# Patient Record
Sex: Male | Born: 1975 | Race: Black or African American | Hispanic: No | Marital: Single | Smoking: Current every day smoker
Health system: Southern US, Community
[De-identification: ages and names within clinical notes are randomized; demographics above are authoritative.]

---

## 2003-08-30 ENCOUNTER — Emergency Department (HOSPITAL_COMMUNITY): Admission: EM | Admit: 2003-08-30 | Discharge: 2003-08-30 | Payer: Self-pay | Admitting: Emergency Medicine

## 2003-12-13 ENCOUNTER — Emergency Department (HOSPITAL_COMMUNITY): Admission: EM | Admit: 2003-12-13 | Discharge: 2003-12-13 | Payer: Self-pay | Admitting: Emergency Medicine

## 2005-01-24 ENCOUNTER — Emergency Department (HOSPITAL_COMMUNITY): Admission: EM | Admit: 2005-01-24 | Discharge: 2005-01-24 | Payer: Self-pay | Admitting: Emergency Medicine

## 2005-06-04 ENCOUNTER — Emergency Department (HOSPITAL_COMMUNITY): Admission: EM | Admit: 2005-06-04 | Discharge: 2005-06-04 | Payer: Self-pay | Admitting: Emergency Medicine

## 2005-06-13 ENCOUNTER — Emergency Department (HOSPITAL_COMMUNITY): Admission: EM | Admit: 2005-06-13 | Discharge: 2005-06-13 | Payer: Self-pay | Admitting: Emergency Medicine

## 2005-10-21 ENCOUNTER — Emergency Department (HOSPITAL_COMMUNITY): Admission: EM | Admit: 2005-10-21 | Discharge: 2005-10-21 | Payer: Self-pay | Admitting: Emergency Medicine

## 2006-11-03 ENCOUNTER — Emergency Department (HOSPITAL_COMMUNITY): Admission: EM | Admit: 2006-11-03 | Discharge: 2006-11-04 | Payer: Self-pay | Admitting: Emergency Medicine

## 2006-12-16 ENCOUNTER — Emergency Department (HOSPITAL_COMMUNITY): Admission: EM | Admit: 2006-12-16 | Discharge: 2006-12-16 | Payer: Self-pay | Admitting: Emergency Medicine

## 2007-01-03 ENCOUNTER — Ambulatory Visit (HOSPITAL_BASED_OUTPATIENT_CLINIC_OR_DEPARTMENT_OTHER): Admission: RE | Admit: 2007-01-03 | Discharge: 2007-01-03 | Payer: Self-pay | Admitting: Orthopedic Surgery

## 2008-12-25 IMAGING — CR DG CHEST 2V
2 series · 2 of 2 positions shown · non-contrast
Comparison: 10/21/05.

CLINICAL DATA: Productive cough for 3 days.  
 CHEST ? 2 VIEW:

[w chest pa]
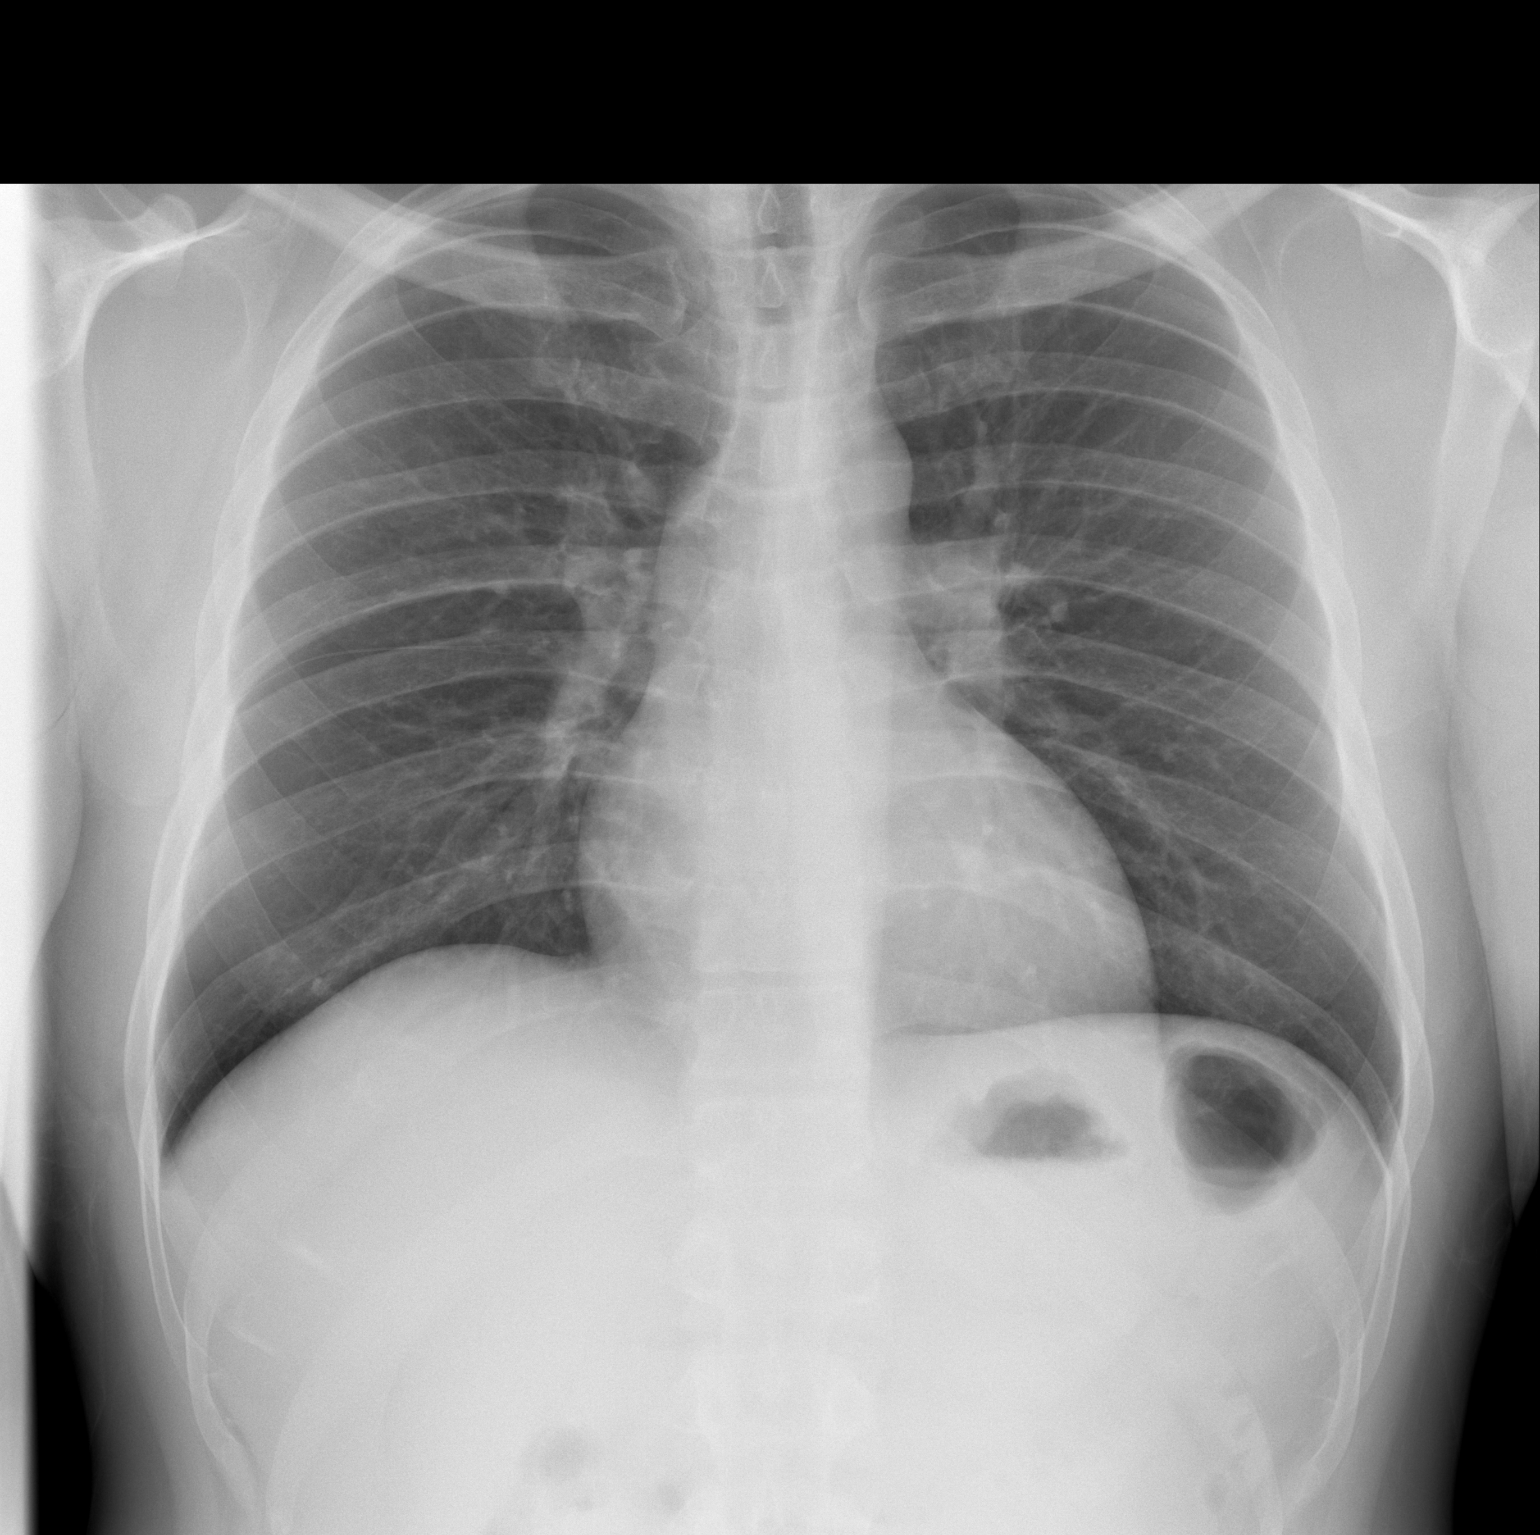

[w chest lat]
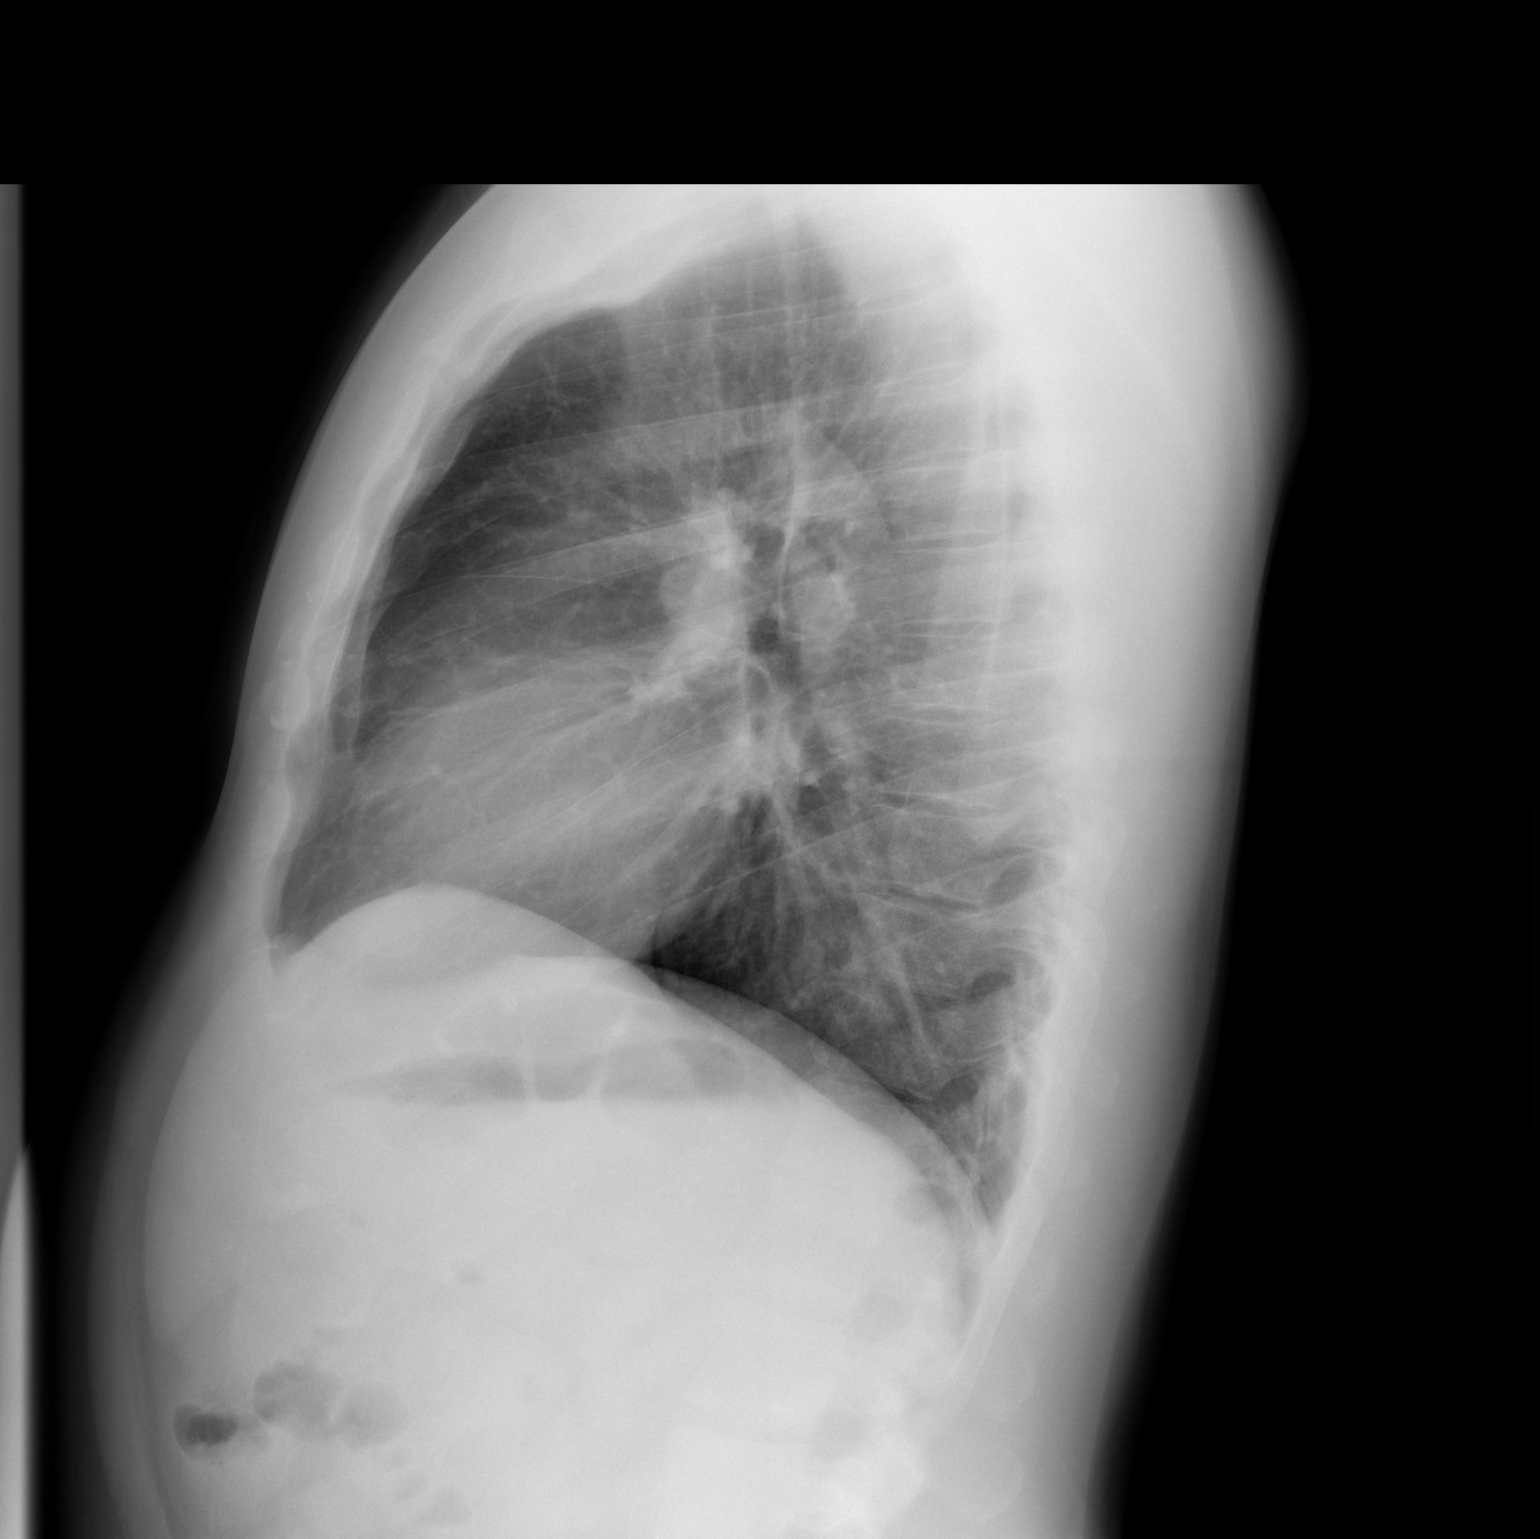

[2 of 2 positions shown; findings below may reference images not displayed]

FINDINGS: Heart size and mediastinal contours are normal.  
 There is no pleural fluid or pulmonary edema.  
 No airspace opacities are identified.
IMPRESSION: No acute cardiopulmonary abnormalities.

## 2009-06-01 ENCOUNTER — Observation Stay (HOSPITAL_COMMUNITY): Admission: EM | Admit: 2009-06-01 | Discharge: 2009-06-02 | Payer: Self-pay | Admitting: Emergency Medicine

## 2010-03-23 LAB — POCT I-STAT 4, (NA,K, GLUC, HGB,HCT)
Glucose, Bld: 96 mg/dL (ref 70–99)
HCT: 45 % (ref 39.0–52.0)

## 2010-04-08 ENCOUNTER — Ambulatory Visit (INDEPENDENT_AMBULATORY_CARE_PROVIDER_SITE_OTHER): Payer: BC Managed Care – PPO

## 2010-04-08 ENCOUNTER — Inpatient Hospital Stay (INDEPENDENT_AMBULATORY_CARE_PROVIDER_SITE_OTHER)
Admission: RE | Admit: 2010-04-08 | Discharge: 2010-04-08 | Disposition: A | Discharge status: Home / Self Care | Payer: BC Managed Care – PPO | Source: Ambulatory Visit | Attending: Emergency Medicine | Admitting: Emergency Medicine

## 2010-04-08 DIAGNOSIS — M533 Sacrococcygeal disorders, not elsewhere classified: Secondary | ICD-10-CM

## 2010-05-19 NOTE — Op Note (Signed)
NAMEPHU, RECORD               ACCOUNT NO.:  [id number]   MEDICAL RECORD NO.:  [id number]          PATIENT TYPE:  AMB   LOCATION:  DSC                          FACILITY:  [facility]   PHYSICIAN:  Leonides Grills, M.D.     DATE OF BIRTH:  1975-11-30   DATE OF PROCEDURE:  01/03/2007  DATE OF DISCHARGE:                               OPERATIVE REPORT   PREOPERATIVE DIAGNOSIS:  Right lateral malleolus fracture.   POSTOPERATIVE DIAGNOSIS:  Right lateral malleolus fracture.   OPERATION:  1. ORIF right lateral malleolus fracture.  2. Stress x-rays right ankle.   ANESTHESIA:  General.   SURGEON:  Leonides Grills, M.D.   ASSISTANT:  Evlyn Kanner, P.A.-C.   ESTIMATED BLOOD LOSS:  Minimal.   TOURNIQUET TIME:  Approximately 40 minutes.   COMPLICATIONS:  None.   DISPOSITION:  Stable to the PR.   INDICATIONS:  This is a 35 year old male who approximately a month ago  fractured his right ankle while wrestling with his friends.  He was  consented for the above procedure.  All risks, which include infection,  nerve or vessel injury, nonunion, union, hardware rotation, hardware  failure, persistent pain, worse pain, prolonged recovery, stiffness, and  arthritis were all explained.  Questions were encouraged and answered.   OPERATION:  The patient was brought to the operating room and placed in  the supine position initially after adequate general endotracheal tube  anesthesia administered, as well as Ancef 1 g IV piggyback.  A bump was  then placed under the right ipsilateral hip, internally rotating the  right lower extremity.  The right lower extremity was then prepped and  draped in a sterile manner over a proximally placed thigh tourniquet.  The limb was gravity exsanguinated.  The tourniquet was elevated to 290  mmHg.  A longitudinal incision over the right lateral malleolus was then  made.  Dissection was carried down through skin.  Hemostasis was  obtained.  Fracture site was then  encountered.  There was already callus  formation, but the fracture was not united.  This was then freed up.  Organized hematoma was removed, and a five-hole, one-third tubular plate  was applied in antilock configuration, and with the clamp on the distal  fragment this was then pulled out to length, and with a separate clamp  this was then reduced anatomically.  We then placed five 3.5 mm fully  threaded cortical set screws using 2.5 mm drill holes, respectively.  This had excellent purchase and maintenance of the reduction.  Final  stress x-rays were obtained, AP, lateral, and mortise views, and showed  no gross motion across the fracture site, fixation, proposition, and  excellent alignment as well.  The medial gutter, which was widened  previously, was now in an anatomic position.  The  tourniquet was deflated,  Hemostasis was obtained.  The area was  copiously irrigated with normal saline.  Subcu was closed with 3-0  Vicryl.  Skin was closed with 4-0 nylon.  A sterile dressing was  applied.  A modified Jones dressing was applied.  The patient was stable  to the PR.      Leonides Grills, M.D.  Electronically Signed     PB/MEDQ  D:  01/03/2007  T:  01/04/2007  Job:  [id number]

## 2010-10-09 LAB — POCT HEMOGLOBIN-HEMACUE
Hemoglobin: 14.2
Operator id: 128471

## 2010-10-14 LAB — RAPID STREP SCREEN (MED CTR MEBANE ONLY): Streptococcus, Group A Screen (Direct): NEGATIVE

## 2016-02-18 ENCOUNTER — Emergency Department (HOSPITAL_COMMUNITY): Payer: Self-pay

## 2016-02-18 ENCOUNTER — Encounter (HOSPITAL_COMMUNITY): Payer: Self-pay | Admitting: *Deleted

## 2016-02-18 ENCOUNTER — Emergency Department (HOSPITAL_COMMUNITY)
Admission: EM | Admit: 2016-02-18 | Discharge: 2016-02-18 | Disposition: A | Discharge status: Home / Self Care | Payer: Self-pay | Attending: Emergency Medicine | Admitting: Emergency Medicine

## 2016-02-18 DIAGNOSIS — F17200 Nicotine dependence, unspecified, uncomplicated: Secondary | ICD-10-CM | POA: Insufficient documentation

## 2016-02-18 DIAGNOSIS — R0789 Other chest pain: Secondary | ICD-10-CM | POA: Insufficient documentation

## 2016-02-18 DIAGNOSIS — Z720 Tobacco use: Secondary | ICD-10-CM

## 2016-02-18 DIAGNOSIS — M25512 Pain in left shoulder: Secondary | ICD-10-CM | POA: Insufficient documentation

## 2016-02-18 LAB — BASIC METABOLIC PANEL
ANION GAP: 9 (ref 5–15)
BUN: <5 mg/dL — ABNORMAL LOW (ref 6–20)
CALCIUM: 9.3 mg/dL (ref 8.9–10.3)
CO2: 24 mmol/L (ref 22–32)
Chloride: 103 mmol/L (ref 101–111)
Creatinine, Ser: 0.87 mg/dL (ref 0.61–1.24)
GFR calc Af Amer: >60 mL/min (ref >60)
GLUCOSE: 108 mg/dL — AB (ref 65–99)
POTASSIUM: 4.2 mmol/L (ref 3.5–5.1)
SODIUM: 136 mmol/L (ref 135–145)

## 2016-02-18 LAB — CBC
HEMATOCRIT: 43.7 % (ref 39.0–52.0)
HEMOGLOBIN: 14.6 g/dL (ref 13.0–17.0)
MCH: 33.6 pg (ref 26.0–34.0)
MCHC: 33.4 g/dL (ref 30.0–36.0)
MCV: 100.7 fL — ABNORMAL HIGH (ref 78.0–100.0)
Platelets: 189 10*3/uL (ref 150–400)
RBC: 4.34 MIL/uL (ref 4.22–5.81)
RDW: 12.3 % (ref 11.5–15.5)
WBC: 7.5 10*3/uL (ref 4.0–10.5)

## 2016-02-18 LAB — I-STAT TROPONIN, ED: TROPONIN I, POC: 0 ng/mL (ref 0.00–0.08)

## 2016-02-18 MED ORDER — KETOROLAC TROMETHAMINE 60 MG/2ML IM SOLN
30.0000 mg | Freq: Once | INTRAMUSCULAR | Status: AC
  Administered 2016-02-18: 30 mg via INTRAMUSCULAR
  Filled 2016-02-18: qty 2

## 2016-02-18 NOTE — ED Triage Notes (Signed)
Pt reports having occ chest pains in past. Now has left shoulder pain since he woke up yesterday. Denies any muscle strain or injury. Reports pain increases with movement. No acute distress noted.

## 2016-02-18 NOTE — Discharge Instructions (Signed)
Please try to quit smoking!  Don't hesitate to return to the emergency department for any new or worsening symptoms.  Using resource guide below to try to establish primary care.  Starting tomorrow you can take Motrin for the shoulder pain as follows: For pain control please take ibuprofen (also known as Motrin or Advil) 800mg  (this is normally 4 over the counter pills) 3 times a day  for 5 days. Take with food to minimize stomach irritation.

## 2016-02-18 NOTE — ED Provider Notes (Signed)
[facility] Provider Note   CSN: [id number] Arrival date & time: 02/18/16  1136     History   Chief Complaint Chief Complaint  Patient presents with  . Shoulder Pain  . Chest Pain     HPI  Martin Kelley is a 41 y.o. male with no significant past medical history who presents with left shoulder pain upon waking yesterday evening. He states that he has had intermittent chest pain for the past few month and was worried that the acute onset shoulder pain could be related to his heart and wanted to be evaluated. Upon waking yesterday his left shoulder felt weak, numb and was aching from the shoulder to the fingers. He lifts boxes at work but denies recent injury or muscle strain. He tried using a muscle relaxer for relief which did not help. His arm is no longer numb and weak at this time, although he complains of 6/10 aching shoulder pain. Patient also has intermittent chest tightness which has been ongoing for the past couple of months. It occurs about once or twice a month and lasts for a few minutes. It is non radiating and is not associated with SOB, nausea, vomiting, peripheral edema, palpitations. Patient smokes tobacco. No family history of sudden cardiac death. Patient is uninsured, he doesn't follow regularly with primary care. He denies any known history of diabetes, hypertension, hyperlipidemia  Home Medications    Prior to Admission medications   Not on File    Family History History reviewed. No pertinent family history.  Social History Social History  Substance Use Topics  . Smoking status: Current Every Day Smoker  . Smokeless tobacco: Not on file  . Alcohol use Yes     Allergies   Patient has no known allergies.   Review of Systems Review of Systems  10 systems reviewed and found to be negative, except as noted in the HPI.  Physical Exam Updated Vital Signs BP 117/94   Pulse (!) 50   Temp 98.5 F (36.9 C) (Oral)   Resp 18   SpO2 98%    Physical Exam  Constitutional: He is oriented to person, place, and time. He appears well-developed and well-nourished. No distress.  HENT:  Head: Normocephalic and atraumatic.  Mouth/Throat: Oropharynx is clear and moist.  Eyes: Conjunctivae and EOM are normal. Pupils are equal, round, and reactive to light.  Neck: Normal range of motion. No JVD present. No tracheal deviation present.  Cardiovascular: Normal rate, regular rhythm and intact distal pulses.   Clubbed fingers bilaterally  Pulmonary/Chest: Effort normal and breath sounds normal. No stridor. No respiratory distress. He has no wheezes. He has no rales. He exhibits no tenderness.  Abdominal: Soft. He exhibits no distension and no mass. There is no tenderness. There is no rebound and no guarding.  Musculoskeletal: Normal range of motion. He exhibits no edema or tenderness.  No calf asymmetry, superficial collaterals, palpable cords, edema, Homans sign negative bilaterally.    Left shoulder:  Shoulder with no deformity. FROM to shoulder and elbow. No TTP of rotator cuff musculature. Drop arm negative. Neurovascularly intact   Neurological: He is alert and oriented to person, place, and time.  Skin: Skin is warm. He is not diaphoretic.  Psychiatric: He has a normal mood and affect.  Nursing note and vitals reviewed.    ED Treatments / Results  Labs (all labs ordered are listed, but only abnormal results are displayed) Labs Reviewed  BASIC METABOLIC PANEL - Abnormal; Notable for the following:  Result Value   Glucose, Bld 108 (*)    BUN <5 (*)    All other components within normal limits  CBC - Abnormal; Notable for the following:    MCV 100.7 (*)    All other components within normal limits  I-STAT TROPOININ, ED    EKG  EKG Interpretation  Date/Time:  Wednesday February 18 2016 11:46:43 EST Ventricular Rate:  63 PR Interval:  162 QRS Duration: 86 QT Interval:  374 QTC Calculation: 382 R  Axis:   61 Text Interpretation:  Normal sinus rhythm Minimal voltage criteria for LVH, may be normal variant Borderline ECG Confirmed by Particia NearingHAVILAND MD, JULIE ([id number]) on 02/18/2016 1:28:15 PM       Radiology Dg Chest 2 View  Result Date: 02/18/2016 CLINICAL DATA:  Chest pain. EXAM: CHEST  2 VIEW COMPARISON:  11/03/2006. FINDINGS: Mediastinum and hilar structures normal. Lungs are clear. Heart size normal. No pleural effusion or pneumothorax. Degenerative changes thoracic spine. IMPRESSION: No acute cardiopulmonary disease. Electronically Signed   By: Maisie Fushomas  Register   On: 02/18/2016 12:00   Dg Shoulder Left  Result Date: 02/18/2016 CLINICAL DATA:  Pain.  No injury. EXAM: LEFT SHOULDER - 2+ VIEW COMPARISON:  No recent prior . FINDINGS: No acute bony or joint abnormality identified. No evidence of fracture dislocation. Mild acromioclavicular and glenohumeral degenerative change. IMPRESSION: Mild acromioclavicular glenohumeral degenerative change. No acute abnormality . Electronically Signed   By: Maisie Fushomas  Register   On: 02/18/2016 13:23    Procedures Procedures (including critical care time)  Medications Ordered in ED Medications  ketorolac (TORADOL) injection 30 mg (30 mg Intramuscular Given 02/18/16 1410)     Initial Impression / Assessment and Plan / ED Course  I have reviewed the triage vital signs and the nursing notes.  Pertinent labs & imaging results that were available during my care of the patient were reviewed by me and considered in my medical decision making (see chart for details).     Vitals:   02/18/16 1335 02/18/16 1345 02/18/16 1400 02/18/16 1430  BP:   133/89 117/94  Pulse: (!) 53 62 61 (!) 50  Resp:      Temp:      TempSrc:      SpO2: 98% 97% 99% 98%    Medications  ketorolac (TORADOL) injection 30 mg (30 mg Intramuscular Given 02/18/16 1410)    Martin Kelley is 41 y.o. male presenting with Intermittent chest pain which is atypical over the last several  months, he's developed left shoulder pain upon waking. It radiates down the arm. Possible impingement, full strength and sensation. EKG with no acute findings, patient is low risk by HEART score.  X-ray of shoulder with arthritis. Patient is given referral to orthopedics, I counseled him extensively on smoking cessation. Given referral to [facility] to try to establish primary care possibly apply for the orange card.  Evaluation does not show pathology that would require ongoing emergent intervention or inpatient treatment. Pt is hemodynamically stable and mentating appropriately. Discussed findings and plan with patient/guardian, who agrees with care plan. All questions answered. Return precautions discussed and outpatient follow up given.    Final Clinical Impressions(s) / ED Diagnoses   Final diagnoses:  Atypical chest pain  Acute pain of left shoulder  Tobacco use    New Prescriptions New Prescriptions   No medications on file     Wynetta Emeryicole Idan Prime, PA-C 02/18/16 1455    Jacalyn LefevreJulie Haviland, MD 02/18/16 1535

## 2016-07-13 ENCOUNTER — Encounter (HOSPITAL_COMMUNITY): Payer: Self-pay | Admitting: Emergency Medicine

## 2016-07-13 ENCOUNTER — Emergency Department (HOSPITAL_COMMUNITY): Payer: Self-pay

## 2016-07-13 ENCOUNTER — Emergency Department (HOSPITAL_COMMUNITY)
Admission: EM | Admit: 2016-07-13 | Discharge: 2016-07-13 | Disposition: A | Discharge status: Home / Self Care | Payer: Self-pay | Attending: Emergency Medicine | Admitting: Emergency Medicine

## 2016-07-13 DIAGNOSIS — J189 Pneumonia, unspecified organism: Secondary | ICD-10-CM | POA: Insufficient documentation

## 2016-07-13 DIAGNOSIS — J181 Lobar pneumonia, unspecified organism: Secondary | ICD-10-CM

## 2016-07-13 DIAGNOSIS — F17200 Nicotine dependence, unspecified, uncomplicated: Secondary | ICD-10-CM | POA: Insufficient documentation

## 2016-07-13 LAB — COMPREHENSIVE METABOLIC PANEL
ALT: 39 U/L (ref 17–63)
AST: 96 U/L — ABNORMAL HIGH (ref 15–41)
Albumin: 3.9 g/dL (ref 3.5–5.0)
Alkaline Phosphatase: 47 U/L (ref 38–126)
Anion gap: 11 (ref 5–15)
BUN: 7 mg/dL (ref 6–20)
CO2: 21 mmol/L — ABNORMAL LOW (ref 22–32)
Calcium: 9.2 mg/dL (ref 8.9–10.3)
Chloride: 94 mmol/L — ABNORMAL LOW (ref 101–111)
Creatinine, Ser: 1 mg/dL (ref 0.61–1.24)
GFR calc Af Amer: >60 mL/min (ref >60)
GFR calc non Af Amer: >60 mL/min (ref >60)
Glucose, Bld: 109 mg/dL — ABNORMAL HIGH (ref 65–99)
Potassium: 3 mmol/L — ABNORMAL LOW (ref 3.5–5.1)
Sodium: 126 mmol/L — ABNORMAL LOW (ref 135–145)
Total Bilirubin: 2 mg/dL — ABNORMAL HIGH (ref 0.3–1.2)
Total Protein: 7.8 g/dL (ref 6.5–8.1)

## 2016-07-13 LAB — CBC WITH DIFFERENTIAL/PLATELET
Basophils Absolute: 0 10*3/uL (ref 0.0–0.1)
Basophils Relative: 0 %
Eosinophils Absolute: 0 10*3/uL (ref 0.0–0.7)
Eosinophils Relative: 0 %
HCT: 43.1 % (ref 39.0–52.0)
Hemoglobin: 15.6 g/dL (ref 13.0–17.0)
Lymphocytes Relative: 17 %
Lymphs Abs: 1.5 10*3/uL (ref 0.7–4.0)
MCH: 35.3 pg — ABNORMAL HIGH (ref 26.0–34.0)
MCHC: 36.2 g/dL — ABNORMAL HIGH (ref 30.0–36.0)
MCV: 97.5 fL (ref 78.0–100.0)
Monocytes Absolute: 0.9 10*3/uL (ref 0.1–1.0)
Monocytes Relative: 11 %
Neutro Abs: 6.3 10*3/uL (ref 1.7–7.7)
Neutrophils Relative %: 72 %
Platelets: 167 10*3/uL (ref 150–400)
RBC: 4.42 MIL/uL (ref 4.22–5.81)
RDW: 12.1 % (ref 11.5–15.5)
WBC: 8.8 10*3/uL (ref 4.0–10.5)

## 2016-07-13 LAB — URINALYSIS, COMPLETE (UACMP) WITH MICROSCOPIC
Bacteria, UA: NONE SEEN
Bilirubin Urine: NEGATIVE
Glucose, UA: NEGATIVE mg/dL
Ketones, ur: 5 mg/dL — AB
Leukocytes, UA: NEGATIVE
Nitrite: NEGATIVE
Protein, ur: 100 mg/dL — AB
Specific Gravity, Urine: 1.018 (ref 1.005–1.030)
Squamous Epithelial / LPF: NONE SEEN
pH: 6 (ref 5.0–8.0)

## 2016-07-13 LAB — RAPID HIV SCREEN (HIV 1/2 AB+AG)
HIV 1/2 Antibodies: NONREACTIVE
HIV-1 P24 Antigen - HIV24: NONREACTIVE

## 2016-07-13 LAB — SEDIMENTATION RATE: Sed Rate: 49 mm/hr — ABNORMAL HIGH (ref 0–16)

## 2016-07-13 LAB — C-REACTIVE PROTEIN: CRP: 19.9 mg/dL — ABNORMAL HIGH (ref <1.0)

## 2016-07-13 MED ORDER — AMOXICILLIN 500 MG PO CAPS: 500.0000 mg | ORAL_CAPSULE | Freq: Three times a day (TID) | ORAL | 0 refills | Status: AC

## 2016-07-13 MED ORDER — DEXTROSE 5 % IV SOLN
1.0000 g | Freq: Once | INTRAVENOUS | Status: DC
  Filled 2016-07-13: qty 10

## 2016-07-13 MED ORDER — SODIUM CHLORIDE 0.9 % IV BOLUS (SEPSIS)
1000.0000 mL | Freq: Once | INTRAVENOUS | Status: AC
  Administered 2016-07-13: 1000 mL via INTRAVENOUS

## 2016-07-13 MED ORDER — AZITHROMYCIN 250 MG PO TABS: 250.0000 mg | ORAL_TABLET | Freq: Every day | ORAL | 0 refills | Status: AC

## 2016-07-13 MED ORDER — POTASSIUM CHLORIDE CRYS ER 20 MEQ PO TBCR
60.0000 meq | EXTENDED_RELEASE_TABLET | Freq: Once | ORAL | Status: AC
  Administered 2016-07-13: 60 meq via ORAL
  Filled 2016-07-13: qty 3

## 2016-07-13 MED ORDER — AZITHROMYCIN 500 MG IV SOLR
500.0000 mg | Freq: Once | INTRAVENOUS | Status: AC
  Administered 2016-07-13: 500 mg via INTRAVENOUS
  Filled 2016-07-13: qty 500

## 2016-07-13 MED ORDER — ACETAMINOPHEN 325 MG PO TABS
325.0000 mg | ORAL_TABLET | Freq: Once | ORAL | Status: AC
  Administered 2016-07-13: 325 mg via ORAL
  Filled 2016-07-13: qty 1

## 2016-07-13 MED ORDER — ACETAMINOPHEN 325 MG PO TABS
325.0000 mg | ORAL_TABLET | Freq: Once | ORAL | Status: AC
  Administered 2016-07-13: 325 mg via ORAL

## 2016-07-13 NOTE — ED Provider Notes (Signed)
[facility] Provider Note   CSN: [id number] Arrival date & time: 07/13/16  0857     History   Chief Complaint Chief Complaint  Patient presents with  . Insomnia  . Excessive Sweating  . Cough  . Headache    HPI Martin Kelley is a 41 y.o. male.  HPI   41yM with cc of insomnia. Reports he has had little/no sleep for about 4 days. He has had additional symptoms for months. Nights sweats. Nonproductive cough. Intermittent dyspnea. Significant weight loss. He cannot quantify but was size 36 waist around January and wearing 32 now. No appetite.   Denies headache. No joint pain/swelling. No rash. No urinary complaints.   No significant past medical history but doesn't have routine medical care. Long time smoker. Denies IVDU. No known malignancy. Denies significant travel history.   History reviewed. No pertinent past medical history.  There are no active problems to display for this patient.   History reviewed. No pertinent surgical history.     Home Medications    Prior to Admission medications   Not on File    Family History No family history on file.  Social History Social History  Substance Use Topics  . Smoking status: Current Every Day Smoker  . Smokeless tobacco: Current User  . Alcohol use Yes     Allergies   Patient has no known allergies.   Review of Systems Review of Systems  All systems reviewed and negative, other than as noted in HPI.  Physical Exam Updated Vital Signs BP 132/88   Pulse (!) 101   Temp (!) 101 F (38.3 C) (Oral)   Resp 14   Ht 5\' 9"  (1.753 m)   Wt 78.5 kg (173 lb)   SpO2 95%   BMI 25.55 kg/m   Physical Exam  Constitutional: He appears well-developed and well-nourished. No distress.  HENT:  Head: Normocephalic and atraumatic.  Eyes: Conjunctivae are normal. Right eye exhibits no discharge. Left eye exhibits no discharge.  Neck: Neck supple.  Cardiovascular: Regular rhythm and normal heart sounds.  Exam  reveals no gallop and no friction rub.   No murmur heard. Mild tachycardia  Pulmonary/Chest: Effort normal and breath sounds normal. No respiratory distress.  Abdominal: Soft. He exhibits no distension. There is no tenderness.  Musculoskeletal: He exhibits no edema or tenderness.  Lower extremities symmetric as compared to each other. No calf tenderness. Negative Homan's. No palpable cords.   Neurological: He is alert.  Skin: Skin is warm and dry. No rash noted.  Psychiatric: He has a normal mood and affect. His behavior is normal. Thought content normal.  Nursing note and vitals reviewed.    ED Treatments / Results  Labs (all labs ordered are listed, but only abnormal results are displayed) Labs Reviewed  CBC WITH DIFFERENTIAL/PLATELET - Abnormal; Notable for the following:       Result Value   MCH 35.3 (*)    MCHC 36.2 (*)    All other components within normal limits  COMPREHENSIVE METABOLIC PANEL - Abnormal; Notable for the following:    Sodium 126 (*)    Potassium 3.0 (*)    Chloride 94 (*)    CO2 21 (*)    Glucose, Bld 109 (*)    AST 96 (*)    Total Bilirubin 2.0 (*)    All other components within normal limits  SEDIMENTATION RATE - Abnormal; Notable for the following:    Sed Rate 49 (*)    All other  components within normal limits  C-REACTIVE PROTEIN - Abnormal; Notable for the following:    CRP 19.9 (*)    All other components within normal limits  URINALYSIS, COMPLETE (UACMP) WITH MICROSCOPIC - Abnormal; Notable for the following:    Color, Urine AMBER (*)    Hgb urine dipstick SMALL (*)    Ketones, ur 5 (*)    Protein, ur 100 (*)    All other components within normal limits  CULTURE, BLOOD (ROUTINE X 2)  CULTURE, BLOOD (ROUTINE X 2)  RAPID HIV SCREEN (HIV 1/2 AB+AG)  RHEUMATOID FACTOR  ANTINUCLEAR ANTIBODIES, IFA    EKG  EKG Interpretation None       Radiology No results found.   Dg Chest 2 View  Result Date: 07/13/2016 CLINICAL DATA:  Fever  and cough and weight loss. EXAM: CHEST  2 VIEW COMPARISON:  02/18/2016 FINDINGS: There is a faint infiltrate in the left lower lobe posterolaterally. The lungs are otherwise clear. Heart size and vascularity are normal. Peribronchial thickening. No acute bone abnormality. IMPRESSION: Left lower lobe pneumonia. Electronically Signed   By: Francene BoyersJames  Maxwell M.D.   On: 07/13/2016 10:15    Procedures Procedures (including critical care time)  Medications Ordered in ED Medications  sodium chloride 0.9 % bolus 1,000 mL (not administered)  acetaminophen (TYLENOL) tablet 325 mg (325 mg Oral Given 07/13/16 0940)  acetaminophen (TYLENOL) tablet 325 mg (325 mg Oral Given 07/13/16 0940)     Initial Impression / Assessment and Plan / ED Course  I have reviewed the triage vital signs and the nursing notes.  Pertinent labs & imaging results that were available during my care of the patient were reviewed by me and considered in my medical decision making (see chart for details).     41yM with night sweats, anorexia, weight loss, fatigue, cough, dyspnea, pleuritic CP.   CXR with hazy LLL infiltrate. Interestingly he was seen in February with atypical CP/L shoulder pain. Could have potentially been related with referred pain to shoulder from lower pulmonary process.   Indolent symptoms for months is suggestive of atypical pneumonia, malignancy, other. 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 Change from labs 4 months ago but I doubt acute (within last few days).  Neurologically he has no complaints. He is not on any chronic medications. Mild LFT abnormalities could be from atypical infection as well.   At this time, will cover for pneumonia. I think he is fine for outpatient treatment. He looks quite well.  No increased WOB. O2 saturations fine.   He absolutely needs FU. Advised to stop smoking. Needs to establish PCP and follow-up again once finishes abx in 1-2 weeks. Emergent return  precautions discussed.   Final Clinical Impressions(s) / ED Diagnoses   Final diagnoses:  Community acquired pneumonia of left lower lobe of lung (HCC)    New Prescriptions New Prescriptions   No medications on file     Raeford RazorKohut, Duriel Deery, MD 07/15/16 1519

## 2016-07-13 NOTE — ED Notes (Signed)
[facility]

## 2016-07-13 NOTE — ED Triage Notes (Signed)
Pt. Stated, for over 2 weeks Ive had cough, headache, unable to sleep, and a lot of sweating.

## 2016-07-13 NOTE — Discharge Instructions (Signed)
Your chest x-ray today shows what appears to be pneumonia in your left lower lobe. You are being treated for presumed pneumonia. The duration of your symptoms is not typical for many organisms that commonly cause pneumonia though. There may be an atypical organism causing your symptoms or something else such as malignancy, rheumatological condition, etc. Make sure you finish taking all of the antibiotics. You NEED to follow-up once you complete your antibiotics. You need to establish a primary care doctor. They can also help you with smoking cessation.

## 2016-07-14 LAB — RHEUMATOID FACTOR: Rhuematoid fact SerPl-aCnc: 16 IU/mL — ABNORMAL HIGH (ref 0.0–13.9)

## 2016-07-15 LAB — ANTINUCLEAR ANTIBODIES, IFA: ANA Ab, IFA: NEGATIVE

## 2016-07-18 LAB — CULTURE, BLOOD (ROUTINE X 2)
Culture: NO GROWTH
Culture: NO GROWTH
Special Requests: ADEQUATE
Special Requests: ADEQUATE

## 2020-11-01 ENCOUNTER — Emergency Department (HOSPITAL_COMMUNITY): Payer: Self-pay

## 2020-11-01 ENCOUNTER — Encounter (HOSPITAL_COMMUNITY): Payer: Self-pay | Admitting: Emergency Medicine

## 2020-11-01 ENCOUNTER — Emergency Department (HOSPITAL_COMMUNITY)
Admission: EM | Admit: 2020-11-01 | Discharge: 2020-11-02 | Disposition: A | Discharge status: Home / Self Care | Payer: Self-pay | Attending: Physician Assistant | Admitting: Physician Assistant

## 2020-11-01 ENCOUNTER — Other Ambulatory Visit: Payer: Self-pay

## 2020-11-01 DIAGNOSIS — R1011 Right upper quadrant pain: Secondary | ICD-10-CM | POA: Insufficient documentation

## 2020-11-01 DIAGNOSIS — Z5321 Procedure and treatment not carried out due to patient leaving prior to being seen by health care provider: Secondary | ICD-10-CM | POA: Insufficient documentation

## 2020-11-01 NOTE — ED Triage Notes (Signed)
Patient reports RUQ pain onset Thursday this week , no emesis or diarrhea , denies fever or chills .

## 2020-11-01 NOTE — ED Provider Notes (Signed)
Emergency Medicine Provider Triage Evaluation Note  Martin Kelley , a 45 y.o. male  was evaluated in triage.  Pt complains of right sided chest/lower rib pain.  This has been present since Thursday. He denies any abdominal pain, N/V/D.  No appetite changes. No fevers or shortness of breath.  He states that he was leaning on something and picking up something heavy the day before this started.   Review of Systems  Positive: Right sided chest pain Negative: Abdominal pain  Physical Exam  BP (!) 143/91   Pulse 88   Temp 97.6 F (36.4 C) (Oral)   Resp 16   Ht 5\' 10"  (1.778 m)   Wt 90 kg   SpO2 100%   BMI 28.47 kg/m  Gen:   Awake, no distress   Resp:  Normal effort  MSK:   Moves extremities without difficulty  Other:  Abd is soft, non tender, non distended. TTP over right lateral chest wall.   Medical Decision Making  Medically screening exam initiated at 11:35 PM.  Appropriate orders placed.  Senica Crall was informed that the remainder of the evaluation will be completed by another provider, this initial triage assessment does not replace that evaluation, and the importance of remaining in the ED until their evaluation is complete.     Fausto Skillern, PA-C 11/01/20 2338    [id number], MD 11/02/20 [phone]

## 2020-11-02 ENCOUNTER — Emergency Department (HOSPITAL_COMMUNITY)
Admission: EM | Admit: 2020-11-02 | Discharge: 2020-11-02 | Disposition: A | Discharge status: Home / Self Care | Payer: Self-pay | Attending: Emergency Medicine | Admitting: Emergency Medicine

## 2020-11-02 ENCOUNTER — Encounter (HOSPITAL_COMMUNITY): Payer: Self-pay | Admitting: Emergency Medicine

## 2020-11-02 ENCOUNTER — Other Ambulatory Visit: Payer: Self-pay

## 2020-11-02 DIAGNOSIS — R0781 Pleurodynia: Secondary | ICD-10-CM | POA: Insufficient documentation

## 2020-11-02 DIAGNOSIS — Y990 Civilian activity done for income or pay: Secondary | ICD-10-CM | POA: Insufficient documentation

## 2020-11-02 DIAGNOSIS — Z5321 Procedure and treatment not carried out due to patient leaving prior to being seen by health care provider: Secondary | ICD-10-CM | POA: Insufficient documentation

## 2020-11-02 NOTE — ED Triage Notes (Signed)
C/o RUQ pain since lighting a pallet over spools at work on Thursday.  States he was seen in ED yesterday and left due to wait.  Pain worse with deep inspiration and laughing.  Denies nausea, vomiting, and diarrhea.

## 2020-11-02 NOTE — ED Notes (Signed)
Pt called for room, no answer.

## 2020-11-02 NOTE — ED Notes (Signed)
Pt called 3 times, no answer 

## 2020-11-02 NOTE — ED Provider Notes (Signed)
Emergency Medicine Provider Triage Evaluation Note  Martin Kelley , a 45 y.o. male  was evaluated in triage.  Pt complains of right sided rib pain after injury at work leaning into heavy spool. LWBS yesterday, xray showed no rib fracture. Pain with inspiration, laughing. Some relief with motrin but not taking anything for pain regularly. Wants to know what he can do for pain.  Review of Systems  Positive: Rib pain, pain with inspiration Negative: Nausea, vomiting  Physical Exam  BP (!) 126/94 (BP Location: Right Arm)   Pulse 72   Temp 98.6 F (37 C)   Resp 17   SpO2 100%  Gen:   Awake, no distress   Resp:  Normal effort  MSK:   Moves extremities without difficulty  Other:  TTP right rib cage  Medical Decision Making  Medically screening exam initiated at 11:42 AM.  Appropriate orders placed.  Sharon Stapel was informed that the remainder of the evaluation will be completed by another provider, this initial triage assessment does not replace that evaluation, and the importance of remaining in the ED until their evaluation is complete.  Right rib pain, no fracture, rapid D/C   Olene Floss, PA-C 11/02/20 1143    Tegeler, Canary Brim, MD 11/02/20 1401

## 2020-11-02 NOTE — ED Notes (Signed)
Sort called to update vitals and no response
